# Patient Record
Sex: Male | Born: 2005 | Race: White | Hispanic: No | Marital: Single | Smoking: Never smoker
Health system: Southern US, Community
[De-identification: ages and names within clinical notes are randomized; demographics above are authoritative.]

## PROBLEM LIST (undated history)

## (undated) DIAGNOSIS — Q2381 Bicuspid aortic valve: Secondary | ICD-10-CM

## (undated) DIAGNOSIS — F32A Depression, unspecified: Secondary | ICD-10-CM

## (undated) DIAGNOSIS — Q231 Congenital insufficiency of aortic valve: Secondary | ICD-10-CM

---
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
## 2024-05-09 ENCOUNTER — Ambulatory Visit
Admission: EM | Admit: 2024-05-09 | Discharge: 2024-05-09 | Disposition: A | Discharge status: Home / Self Care | Attending: Student | Admitting: Student

## 2024-05-09 ENCOUNTER — Other Ambulatory Visit: Payer: Self-pay

## 2024-05-09 ENCOUNTER — Encounter: Payer: Self-pay | Admitting: Emergency Medicine

## 2024-05-09 DIAGNOSIS — J029 Acute pharyngitis, unspecified: Secondary | ICD-10-CM | POA: Insufficient documentation

## 2024-05-09 DIAGNOSIS — R6889 Other general symptoms and signs: Secondary | ICD-10-CM | POA: Insufficient documentation

## 2024-05-09 LAB — POCT RAPID STREP A (OFFICE): Rapid Strep A Screen: NEGATIVE

## 2024-05-09 MED ORDER — PROMETHAZINE-DM 6.25-15 MG/5ML PO SYRP: 5.0000 mL | ORAL_SOLUTION | Freq: Four times a day (QID) | ORAL | 0 refills | Status: AC | PRN

## 2024-05-09 MED ORDER — OSELTAMIVIR PHOSPHATE 75 MG PO CAPS: 75.0000 mg | ORAL_CAPSULE | Freq: Two times a day (BID) | ORAL | 0 refills | Status: AC

## 2024-05-09 MED ORDER — ONDANSETRON 4 MG PO TBDP: 4.0000 mg | ORAL_TABLET | Freq: Three times a day (TID) | ORAL | 0 refills | Status: AC | PRN

## 2024-05-09 NOTE — ED Provider Notes (Signed)
 " [facility]    CSN: [id number] Arrival date & time: 05/09/24  0933      History   Chief Complaint Chief Complaint  Patient presents with   Sore Throat    HPI Alois Colgan is a 19 y.o. male presenting with sore throat and fever. Pt sts sore throat and tactile fever x 3 days; pt sts some cough but denies nasal congestion. Endorses myalgias. States nausea with 1 bilioous episode of emesis 1 day ago, and 1 episode today. Denies abd pain, diarrhea.  The patient denies a history of pulmonary disease  HPI  Past Medical History:  Diagnosis Date   Bicuspid aortic valve    Depression     Patient Active Problem List   Diagnosis Date Noted   GAD (generalized anxiety disorder) 10/14/2023   MDD (major depressive disorder), recurrent episode, severe (HCC) 10/13/2023   MDD (major depressive disorder), recurrent episode 07/21/2021   PTSD (post-traumatic stress disorder) 01/19/2021   MDD (major depressive disorder), recurrent severe, without psychosis (HCC) 01/19/2021    History reviewed. No pertinent surgical history.     Home Medications    Prior to Admission medications  Medication Sig Start Date End Date Taking? Authorizing Provider  ondansetron  (ZOFRAN -ODT) 4 MG disintegrating tablet Take 1 tablet (4 mg total) by mouth every 8 (eight) hours as needed for nausea or vomiting. 05/09/24  Yes Jamale Spangler E, PA-C  oseltamivir  (TAMIFLU ) 75 MG capsule Take 1 capsule (75 mg total) by mouth every 12 (twelve) hours. 05/09/24  Yes Arrington Yohe E, PA-C  promethazine -dextromethorphan (PROMETHAZINE -DM) 6.25-15 MG/5ML syrup Take 5 mLs by mouth 4 (four) times daily as needed for cough. 05/09/24  Yes Samyuktha Brau E, PA-C  mirtazapine  (REMERON ) 15 MG tablet Take 1 tablet (15 mg total) by mouth at bedtime. 10/17/23   Kennyth Starleen RAMAN, MD  risperiDONE  (RISPERDAL ) 1 MG tablet Take 1 tablet (1 mg total) by mouth at bedtime. 10/17/23   Kennyth Starleen RAMAN, MD    Family History History reviewed.  No pertinent family history.  Social History Social History[1]   Allergies   Patient has no known allergies.   Review of Systems Review of Systems  Constitutional:  Positive for fever. Negative for appetite change and chills.  HENT:  Positive for sore throat. Negative for congestion, ear pain, rhinorrhea, sinus pressure and sinus pain.   Eyes:  Negative for redness and visual disturbance.  Respiratory:  Negative for cough, chest tightness, shortness of breath and wheezing.   Cardiovascular:  Negative for chest pain and palpitations.  Gastrointestinal:  Negative for abdominal pain, constipation, diarrhea, nausea and vomiting.  Genitourinary:  Negative for dysuria, frequency and urgency.  Musculoskeletal:  Negative for myalgias.  Neurological:  Negative for dizziness, weakness and headaches.  Psychiatric/Behavioral:  Negative for confusion.   All other systems reviewed and are negative.    Physical Exam Triage Vital Signs ED Triage Vitals  Encounter Vitals Group     BP      Girls Systolic BP Percentile      Girls Diastolic BP Percentile      Boys Systolic BP Percentile      Boys Diastolic BP Percentile      Pulse      Resp      Temp      Temp src      SpO2      Weight      Height      Head Circumference      Peak  Flow      Pain Score      Pain Loc      Pain Education      Exclude from Growth Chart    No data found.  Updated Vital Signs BP 120/68 (BP Location: Left Arm)   Pulse 89   Temp 99.1 F (37.3 C) (Oral)   Resp 18   SpO2 96%   Visual Acuity Right Eye Distance:   Left Eye Distance:   Bilateral Distance:    Right Eye Near:   Left Eye Near:    Bilateral Near:     Physical Exam Vitals reviewed.  Constitutional:      General: He is not in acute distress.    Appearance: Normal appearance. He is not ill-appearing.  HENT:     Head: Normocephalic and atraumatic.     Right Ear: Tympanic membrane, ear canal and external ear normal. No tenderness. No  middle ear effusion. There is no impacted cerumen. Tympanic membrane is not perforated, erythematous, retracted or bulging.     Left Ear: Tympanic membrane, ear canal and external ear normal. No tenderness.  No middle ear effusion. There is no impacted cerumen. Tympanic membrane is not perforated, erythematous, retracted or bulging.     Nose: Nose normal. No congestion.     Mouth/Throat:     Mouth: Mucous membranes are moist.     Pharynx: Uvula midline. No oropharyngeal exudate or posterior oropharyngeal erythema.     Tonsils: No tonsillar exudate.  Eyes:     Extraocular Movements: Extraocular movements intact.     Pupils: Pupils are equal, round, and reactive to light.  Cardiovascular:     Rate and Rhythm: Normal rate and regular rhythm.     Heart sounds: Normal heart sounds.  Pulmonary:     Effort: Pulmonary effort is normal.     Breath sounds: Normal breath sounds. No decreased breath sounds, wheezing, rhonchi or rales.  Abdominal:     Palpations: Abdomen is soft.     Tenderness: There is no abdominal tenderness. There is no guarding or rebound.  Lymphadenopathy:     Cervical: No cervical adenopathy.     Right cervical: No superficial, deep or posterior cervical adenopathy.    Left cervical: No superficial, deep or posterior cervical adenopathy.  Skin:    Comments: No rash   Neurological:     General: No focal deficit present.     Mental Status: He is alert and oriented to person, place, and time.  Psychiatric:        Mood and Affect: Mood normal.        Behavior: Behavior normal.        Thought Content: Thought content normal.        Judgment: Judgment normal.      UC Treatments / Results  Labs (all labs ordered are listed, but only abnormal results are displayed) Labs Reviewed  CULTURE, GROUP A STREP [facility])  POCT RAPID STREP A (OFFICE)    EKG   Radiology No results found.  Procedures Procedures (including critical care time)  Medications Ordered in  UC Medications - No data to display  Initial Impression / Assessment and Plan / UC Course  I have reviewed the triage vital signs and the nursing notes.  Pertinent labs & imaging results that were available during my care of the patient were reviewed by me and considered in my medical decision making (see chart for details).     Patient is a pleasant 19  y.o. male presenting with flu-like illness. The patient is afebrile and nontachycardic.  Antipyretic has not been administered today.  -Rapid strep negative, culture sent.   Due to updated clinic guideline, and high prevalence of influenza in the community, we did not check a flu test for patient with flulike symptoms.  The patient is in agreement.  Following discussion, Tamiflu  sent.  Zofran  sent to have on hand.  Promethazine  DM sent for symptomatic relief.  Return precautions as below.   Final Clinical Impressions(s) / UC Diagnoses   Final diagnoses:  Acute pharyngitis, unspecified etiology  Flu-like symptoms     Discharge Instructions      -Based on your symptoms, I suspect that you have influenza (the flu).  -Your strep test was negative.  -We are treating this with a medication called Tamiflu , which is an antiviral.  This will help reduce the severity and duration of the fluid. -Take the Zofran  (ondansetron ) up to 3 times daily if you develop nausea and vomiting. Dissolve one pill under your tongue or between your teeth and your cheek -Promethazine  DM cough syrup for congestion/cough. This could make you drowsy, so take at night before bed. -Use tylenol  and/or ibuprofen  for fever reduction or bodyaches, if needed.  Follow the instructions on the bottle for appropriate dosage.  You can take both Tylenol  and ibuprofen ; you can take them at the same time, or alternate every few hours (for example, Tylenol , then 4 hours later ibuprofen , then 4 hours later Tylenol , etc.) -Viruses like the flu typically last 5 to 7 days.  After 7  days, your symptoms should be improving rather than worsening.  If your symptoms improve, and then worsen again, this is when we worry about a sinus infection or a lung infection, and you should return for additional care.  If at any point you develop fevers that do not reduce with Tylenol , shortness of breath, a cough productive of dark or red sputum, or other symptoms that concern you, seek additional care.      ED Prescriptions     Medication Sig Dispense Auth. Provider   oseltamivir  (TAMIFLU ) 75 MG capsule Take 1 capsule (75 mg total) by mouth every 12 (twelve) hours. 10 capsule Saadiya Wilfong E, PA-C   ondansetron  (ZOFRAN -ODT) 4 MG disintegrating tablet Take 1 tablet (4 mg total) by mouth every 8 (eight) hours as needed for nausea or vomiting. 20 tablet Bryceton Hantz E, PA-C   promethazine -dextromethorphan (PROMETHAZINE -DM) 6.25-15 MG/5ML syrup Take 5 mLs by mouth 4 (four) times daily as needed for cough. 118 mL Oumou Smead E, PA-C      PDMP not reviewed this encounter.     [1]  Social History Tobacco Use   Smoking status: Never   Smokeless tobacco: Never  Vaping Use   Vaping status: Never Used  Substance Use Topics   Alcohol use: Never   Drug use: Never     Arlyss Leita BRAVO, PA-C 05/09/24 1008  "

## 2024-05-09 NOTE — ED Triage Notes (Signed)
 Pt sts sore throat and fever x 3 days; pt sts some cough but denies nasal congestion

## 2024-05-09 NOTE — Discharge Instructions (Addendum)
-  Based on your symptoms, I suspect that you have influenza (the flu).  -Your strep test was negative.  -We are treating this with a medication called Tamiflu , which is an antiviral.  This will help reduce the severity and duration of the fluid. -Take the Zofran  (ondansetron ) up to 3 times daily if you develop nausea and vomiting. Dissolve one pill under your tongue or between your teeth and your cheek -Promethazine  DM cough syrup for congestion/cough. This could make you drowsy, so take at night before bed. -Use tylenol  and/or ibuprofen  for fever reduction or bodyaches, if needed.  Follow the instructions on the bottle for appropriate dosage.  You can take both Tylenol  and ibuprofen ; you can take them at the same time, or alternate every few hours (for example, Tylenol , then 4 hours later ibuprofen , then 4 hours later Tylenol , etc.) -Viruses like the flu typically last 5 to 7 days.  After 7 days, your symptoms should be improving rather than worsening.  If your symptoms improve, and then worsen again, this is when we worry about a sinus infection or a lung infection, and you should return for additional care.  If at any point you develop fevers that do not reduce with Tylenol , shortness of breath, a cough productive of dark or red sputum, or other symptoms that concern you, seek additional care.

## 2024-05-12 ENCOUNTER — Ambulatory Visit: Payer: Self-pay

## 2024-05-12 LAB — CULTURE, GROUP A STREP (THRC)
